# Patient Record
Sex: Female | Born: 1935 | Race: Black or African American | Hispanic: No | Smoking: Never smoker
Health system: Southern US, Community
[De-identification: ages and names within clinical notes are randomized; demographics above are authoritative.]

## PROBLEM LIST (undated history)

## (undated) DIAGNOSIS — I10 Essential (primary) hypertension: Secondary | ICD-10-CM

## (undated) DIAGNOSIS — F0390 Unspecified dementia without behavioral disturbance: Secondary | ICD-10-CM

## (undated) DIAGNOSIS — N63 Unspecified lump in unspecified breast: Secondary | ICD-10-CM

## (undated) DIAGNOSIS — I639 Cerebral infarction, unspecified: Secondary | ICD-10-CM

## (undated) HISTORY — PX: ABDOMINAL HYSTERECTOMY: SHX81

---

## 2003-06-09 ENCOUNTER — Other Ambulatory Visit: Payer: Self-pay

## 2003-11-26 ENCOUNTER — Ambulatory Visit: Payer: Self-pay | Admitting: Internal Medicine

## 2004-04-05 ENCOUNTER — Ambulatory Visit: Payer: Self-pay | Admitting: Internal Medicine

## 2004-04-25 ENCOUNTER — Ambulatory Visit: Payer: Self-pay | Admitting: Internal Medicine

## 2004-10-02 ENCOUNTER — Ambulatory Visit: Payer: Self-pay | Admitting: Internal Medicine

## 2004-10-26 ENCOUNTER — Ambulatory Visit: Payer: Self-pay | Admitting: Internal Medicine

## 2005-06-17 ENCOUNTER — Inpatient Hospital Stay: Payer: Self-pay | Admitting: Internal Medicine

## 2005-08-09 ENCOUNTER — Ambulatory Visit: Payer: Self-pay | Admitting: Obstetrics and Gynecology

## 2005-08-09 ENCOUNTER — Other Ambulatory Visit: Payer: Self-pay

## 2005-08-12 ENCOUNTER — Ambulatory Visit: Payer: Self-pay | Admitting: Obstetrics and Gynecology

## 2007-06-04 ENCOUNTER — Ambulatory Visit: Payer: Self-pay | Admitting: Otolaryngology

## 2008-03-22 ENCOUNTER — Ambulatory Visit: Payer: Self-pay | Admitting: Neurology

## 2008-04-15 ENCOUNTER — Emergency Department: Payer: Self-pay | Admitting: Emergency Medicine

## 2009-10-22 ENCOUNTER — Ambulatory Visit: Payer: Self-pay | Admitting: Internal Medicine

## 2010-11-24 ENCOUNTER — Ambulatory Visit: Payer: Self-pay | Admitting: Internal Medicine

## 2011-06-04 ENCOUNTER — Ambulatory Visit: Payer: Self-pay | Admitting: Family Medicine

## 2011-10-16 ENCOUNTER — Ambulatory Visit: Payer: Self-pay | Admitting: Family Medicine

## 2012-01-12 ENCOUNTER — Ambulatory Visit: Payer: Self-pay | Admitting: Family Medicine

## 2012-07-10 ENCOUNTER — Emergency Department: Payer: Self-pay | Admitting: Emergency Medicine

## 2013-04-18 ENCOUNTER — Ambulatory Visit: Payer: Self-pay | Admitting: Internal Medicine

## 2013-04-18 LAB — CBC WITH DIFFERENTIAL/PLATELET
Basophil #: 0.1 10*3/uL (ref 0.0–0.1)
Basophil %: 1.4 %
EOS PCT: 3.6 %
Eosinophil #: 0.3 10*3/uL (ref 0.0–0.7)
HCT: 33.9 % — AB (ref 35.0–47.0)
HGB: 10.8 g/dL — ABNORMAL LOW (ref 12.0–16.0)
Lymphocyte #: 1.8 10*3/uL (ref 1.0–3.6)
Lymphocyte %: 21.8 %
MCH: 28.6 pg (ref 26.0–34.0)
MCHC: 32 g/dL (ref 32.0–36.0)
MCV: 89 fL (ref 80–100)
MONO ABS: 0.5 x10 3/mm (ref 0.2–0.9)
MONOS PCT: 6.5 %
Neutrophil #: 5.4 10*3/uL (ref 1.4–6.5)
Neutrophil %: 66.7 %
PLATELETS: 319 10*3/uL (ref 150–440)
RBC: 3.79 10*6/uL — ABNORMAL LOW (ref 3.80–5.20)
RDW: 14.9 % — ABNORMAL HIGH (ref 11.5–14.5)
WBC: 8 10*3/uL (ref 3.6–11.0)

## 2013-04-18 LAB — URINALYSIS, COMPLETE
BACTERIA: NEGATIVE
Bilirubin,UR: NEGATIVE
Blood: NEGATIVE
GLUCOSE, UR: NEGATIVE mg/dL (ref 0–75)
Ketone: NEGATIVE
LEUKOCYTE ESTERASE: NEGATIVE
Nitrite: NEGATIVE
PH: 6 (ref 4.5–8.0)
RBC,UR: NONE SEEN /HPF (ref 0–5)
SPECIFIC GRAVITY: 1.01 (ref 1.003–1.030)

## 2013-04-18 LAB — BASIC METABOLIC PANEL
Anion Gap: 15 (ref 7–16)
BUN: 15 mg/dL (ref 7–18)
CREATININE: 1.48 mg/dL — AB (ref 0.60–1.30)
Calcium, Total: 8.6 mg/dL (ref 8.5–10.1)
Chloride: 105 mmol/L (ref 98–107)
Co2: 19 mmol/L — ABNORMAL LOW (ref 21–32)
EGFR (Non-African Amer.): 30 — ABNORMAL LOW
GFR CALC AF AMER: 35 — AB
GLUCOSE: 121 mg/dL — AB (ref 65–99)
OSMOLALITY: 280 (ref 275–301)
Potassium: 3.9 mmol/L (ref 3.5–5.1)
Sodium: 139 mmol/L (ref 136–145)

## 2013-04-20 LAB — URINE CULTURE

## 2014-02-09 ENCOUNTER — Ambulatory Visit: Payer: Self-pay | Admitting: Family Medicine

## 2015-08-26 DIAGNOSIS — I639 Cerebral infarction, unspecified: Secondary | ICD-10-CM

## 2015-08-26 HISTORY — DX: Cerebral infarction, unspecified: I63.9

## 2015-11-06 ENCOUNTER — Emergency Department: Payer: Medicare Other

## 2015-11-06 ENCOUNTER — Emergency Department
Admission: EM | Admit: 2015-11-06 | Discharge: 2015-11-06 | Disposition: A | Discharge status: Home / Self Care | Payer: Medicare Other | Attending: Emergency Medicine | Admitting: Emergency Medicine

## 2015-11-06 ENCOUNTER — Encounter: Payer: Self-pay | Admitting: Intensive Care

## 2015-11-06 DIAGNOSIS — Z79899 Other long term (current) drug therapy: Secondary | ICD-10-CM | POA: Diagnosis not present

## 2015-11-06 DIAGNOSIS — Y92129 Unspecified place in nursing home as the place of occurrence of the external cause: Secondary | ICD-10-CM | POA: Insufficient documentation

## 2015-11-06 DIAGNOSIS — Y999 Unspecified external cause status: Secondary | ICD-10-CM | POA: Insufficient documentation

## 2015-11-06 DIAGNOSIS — I10 Essential (primary) hypertension: Secondary | ICD-10-CM | POA: Insufficient documentation

## 2015-11-06 DIAGNOSIS — S0990XA Unspecified injury of head, initial encounter: Secondary | ICD-10-CM | POA: Diagnosis present

## 2015-11-06 DIAGNOSIS — W19XXXA Unspecified fall, initial encounter: Secondary | ICD-10-CM | POA: Insufficient documentation

## 2015-11-06 DIAGNOSIS — Y939 Activity, unspecified: Secondary | ICD-10-CM | POA: Insufficient documentation

## 2015-11-06 DIAGNOSIS — S0101XA Laceration without foreign body of scalp, initial encounter: Secondary | ICD-10-CM | POA: Diagnosis not present

## 2015-11-06 DIAGNOSIS — IMO0002 Reserved for concepts with insufficient information to code with codable children: Secondary | ICD-10-CM

## 2015-11-06 HISTORY — DX: Cerebral infarction, unspecified: I63.9

## 2015-11-06 HISTORY — DX: Unspecified dementia, unspecified severity, without behavioral disturbance, psychotic disturbance, mood disturbance, and anxiety: F03.90

## 2015-11-06 HISTORY — DX: Essential (primary) hypertension: I10

## 2015-11-06 LAB — COMPREHENSIVE METABOLIC PANEL
ALBUMIN: 3.2 g/dL — AB (ref 3.5–5.0)
ALK PHOS: 96 U/L (ref 38–126)
ALT: 11 U/L — ABNORMAL LOW (ref 14–54)
AST: 20 U/L (ref 15–41)
Anion gap: 6 (ref 5–15)
BILIRUBIN TOTAL: 0.2 mg/dL — AB (ref 0.3–1.2)
BUN: 32 mg/dL — AB (ref 6–20)
CALCIUM: 8.7 mg/dL — AB (ref 8.9–10.3)
CO2: 23 mmol/L (ref 22–32)
CREATININE: 1.38 mg/dL — AB (ref 0.44–1.00)
Chloride: 108 mmol/L (ref 101–111)
GFR calc Af Amer: 36 mL/min — ABNORMAL LOW (ref >60)
GFR, EST NON AFRICAN AMERICAN: 31 mL/min — AB (ref >60)
GLUCOSE: 99 mg/dL (ref 65–99)
Potassium: 4.2 mmol/L (ref 3.5–5.1)
Sodium: 137 mmol/L (ref 135–145)
TOTAL PROTEIN: 7.6 g/dL (ref 6.5–8.1)

## 2015-11-06 LAB — CBC WITH DIFFERENTIAL/PLATELET
BASOS ABS: 0 10*3/uL (ref 0–0.1)
BASOS PCT: 1 %
Eosinophils Absolute: 0.3 10*3/uL (ref 0–0.7)
Eosinophils Relative: 6 %
HEMATOCRIT: 31.5 % — AB (ref 35.0–47.0)
HEMOGLOBIN: 10.3 g/dL — AB (ref 12.0–16.0)
LYMPHS PCT: 36 %
Lymphs Abs: 1.9 10*3/uL (ref 1.0–3.6)
MCH: 27.6 pg (ref 26.0–34.0)
MCHC: 32.7 g/dL (ref 32.0–36.0)
MCV: 84.4 fL (ref 80.0–100.0)
MONO ABS: 0.2 10*3/uL (ref 0.2–0.9)
MONOS PCT: 4 %
NEUTROS ABS: 2.9 10*3/uL (ref 1.4–6.5)
NEUTROS PCT: 53 %
Platelets: 196 10*3/uL (ref 150–440)
RBC: 3.74 MIL/uL — ABNORMAL LOW (ref 3.80–5.20)
RDW: 17.1 % — ABNORMAL HIGH (ref 11.5–14.5)
WBC: 5.3 10*3/uL (ref 3.6–11.0)

## 2015-11-06 NOTE — ED Provider Notes (Signed)
[facility] Emergency Department Provider Note     L5 caveat: Review of systems and history is limited by dementia   Time seen: ----------------------------------------- 8:31 AM on 11/06/2015 -----------------------------------------    I have reviewed the triage vital signs and the nursing notes.   HISTORY  Chief Complaint Facial Laceration and Fall    HPI Sheri Jordan is a 82 y.o. female who presents to the ER being brought by EMS after a fall the nursing home. Fall was unwitnessed. Patient unable to give review of systems or report due to dementia.   No past medical history on file.  There are no active problems to display for this patient.   No past surgical history on file.  Allergies Review of patient's allergies indicates not on file.  Social History Social History  Substance Use Topics  . Smoking status: Not on file  . Smokeless tobacco: Not on file  . Alcohol use Not on file    Review of Systems Unknown, positive for right facial laceration  ____________________________________________   PHYSICAL EXAM:  VITAL SIGNS: ED Triage Vitals  Enc Vitals Group     BP      Pulse      Resp      Temp      Temp src      SpO2      Weight      Height      Head Circumference      Peak Flow      Pain Score      Pain Loc      Pain Edu?      Excl. in [place]?     Constitutional: Alert, No acute distress ENT   Head: Normocephalic, 5 cm linear right temporal scalp laceration   Nose: No congestion/rhinnorhea.   Mouth/Throat: Mucous membranes are moist.   Neck: No stridor. Cardiovascular: Normal rate, regular rhythm.  Respiratory: Normal respiratory effort without tachypnea nor retractions. Breath sounds are clear and equal bilaterally. No wheezes/rales/rhonchi. Gastrointestinal: Soft and nontender. Normal bowel sounds Musculoskeletal: Contractures noted in the lower extremities Neurologic:  No gross focal  neurologic deficits are appreciated.  Skin:  5 cm linear frontotemporal scalp laceration with pulsatile bleeding ____________________________________________  ED COURSE:  Pertinent labs & imaging results that were available during my care of the patient were reviewed by me and considered in my medical decision making (see chart for details). Clinical Course  Patient presents to ER after unwitnessed fall the nursing home. She will require emergent laceration repair due to pulsatile bleeding.  Marland Kitchen.Laceration Repair Date/Time: 11/06/2015 8:33 AM Performed by: Earleen Newport Authorized by: Lenise Arena E   Consent:    Consent obtained:  Emergent situation Laceration details:    Location:  Scalp   Scalp location:  R temporal   Length (cm):  5   Depth (mm):  0.5 Repair type:    Repair type:  Complex Pre-procedure details:    Preparation:  Patient was prepped and draped in usual sterile fashion Exploration:    Limited defect created (wound extended): no     Hemostasis achieved with:  Epinephrine and direct pressure   Wound extent: vascular damage     Contaminated: no   Treatment:    Area cleansed with:  Betadine and saline   Amount of cleaning:  Standard   Irrigation solution:  Sterile saline   Debridement:  None Skin repair:    Repair method:  Sutures   Suture size:  4-0  Suture material:  Nylon   Suture technique:  Running   Number of sutures:  8 Approximation:    Approximation:  Close   Vermilion border: well-aligned   Post-procedure details:    Dressing:  Antibiotic ointment and non-adherent dressing   Patient tolerance of procedure:  Tolerated well, no immediate complications Comments:     Patient presented with pulsatile bleeding from the right temporal scalp. This appeared to be an arteriole from the temporal artery. With local injection and tight wound repair, hemostasis was obtained.   ____________________________________________   LABS (pertinent  positives/negatives)  Labs Reviewed  CBC WITH DIFFERENTIAL/PLATELET - Abnormal; Notable for the following:       Result Value   RBC 3.74 (*)    Hemoglobin 10.3 (*)    HCT 31.5 (*)    RDW 17.1 (*)    All other components within normal limits  COMPREHENSIVE METABOLIC PANEL - Abnormal; Notable for the following:    BUN 32 (*)    Creatinine, Ser 1.38 (*)    Calcium 8.7 (*)    Albumin 3.2 (*)    ALT 11 (*)    Total Bilirubin 0.2 (*)    GFR calc non Af Amer 31 (*)    GFR calc Af Amer 36 (*)    All other components within normal limits   ____________________________________________  FINAL ASSESSMENT AND PLAN  Laceration repair, minor head injury  Plan: Patient with labs and imaging as dictated above. Patient's labs are at baseline for her, she was observed without any rebleeding from the wound. Surgicel was placed over the wound with a nonadherent dressing and Kerlix. She is stable for outpatient follow-up with her doctor.   Earleen Newport, MD   Note: This dictation was prepared with Dragon dictation. Any transcriptional errors that result from this process are unintentional    Earleen Newport, MD 11/06/15 1123

## 2015-11-06 NOTE — ED Notes (Signed)
Pt given apple sauce and juice. 

## 2015-11-06 NOTE — ED Notes (Signed)
Secretary calling for transport for EMS back to [facility]

## 2015-11-06 NOTE — ED Notes (Signed)
Patient transported to CT 

## 2015-11-06 NOTE — ED Triage Notes (Signed)
Pt arrived by EMS from [facility]. Staff reports unwitnessed fall. Staff wrapped laceration due to blood squirting from site. MD at bedside upon arrival. Pts confusion is baseline per EMS from staff. EMS vitals stable. Blood sugar EMS 86. Patient Alert

## 2015-11-09 ENCOUNTER — Telehealth: Payer: Self-pay

## 2015-11-09 NOTE — Telephone Encounter (Signed)
Transportation for patient called wanting to schedule an appointment for a Right Breast biopsy on patient. Patient has not had Mammogram. Explained that mammogram and ultrasound would need to be obtained prior to being able to do breast biopsy. They will obtain order from Dr. Brynda Greathouse, call [facility] and schedule Mammogram, and then call back for an appointment.  Please schedule patient with next available provider after mammogram has been completed.

## 2015-11-13 ENCOUNTER — Other Ambulatory Visit: Payer: Self-pay | Admitting: Internal Medicine

## 2015-11-13 DIAGNOSIS — C50411 Malignant neoplasm of upper-outer quadrant of right female breast: Secondary | ICD-10-CM

## 2015-11-14 NOTE — Telephone Encounter (Signed)
Made return phone call to Dr. Jerene Dilling office whom states that they do not want to schedule office visit at this time. They will obtain mammogram and go from there.   Asked to return phone call when they would like an appointment scheduled.

## 2015-11-27 ENCOUNTER — Ambulatory Visit: Admission: RE | Admit: 2015-11-27 | Payer: Medicare Other | Source: Ambulatory Visit

## 2015-11-27 ENCOUNTER — Ambulatory Visit
Admission: RE | Admit: 2015-11-27 | Discharge: 2015-11-27 | Disposition: A | Discharge status: Home / Self Care | Payer: Medicare Other | Source: Ambulatory Visit | Attending: Internal Medicine | Admitting: Internal Medicine

## 2015-11-27 DIAGNOSIS — N6312 Unspecified lump in the right breast, upper inner quadrant: Secondary | ICD-10-CM | POA: Diagnosis not present

## 2015-11-27 DIAGNOSIS — C50411 Malignant neoplasm of upper-outer quadrant of right female breast: Secondary | ICD-10-CM

## 2015-11-27 DIAGNOSIS — N63 Unspecified lump in unspecified breast: Secondary | ICD-10-CM | POA: Diagnosis present

## 2015-11-27 HISTORY — DX: Unspecified lump in unspecified breast: N63.0

## 2015-12-01 ENCOUNTER — Other Ambulatory Visit: Payer: Self-pay | Admitting: Internal Medicine

## 2015-12-01 DIAGNOSIS — N6310 Unspecified lump in the right breast, unspecified quadrant: Secondary | ICD-10-CM

## 2015-12-05 ENCOUNTER — Ambulatory Visit
Admission: RE | Admit: 2015-12-05 | Discharge: 2015-12-05 | Disposition: A | Discharge status: Home / Self Care | Payer: Medicare Other | Source: Ambulatory Visit | Attending: Internal Medicine | Admitting: Internal Medicine

## 2015-12-19 ENCOUNTER — Ambulatory Visit
Admission: RE | Admit: 2015-12-19 | Discharge: 2015-12-19 | Disposition: A | Discharge status: Home / Self Care | Payer: Medicare Other | Source: Ambulatory Visit | Attending: Internal Medicine | Admitting: Internal Medicine

## 2015-12-19 DIAGNOSIS — N6310 Unspecified lump in the right breast, unspecified quadrant: Secondary | ICD-10-CM

## 2015-12-21 LAB — SURGICAL PATHOLOGY

## 2015-12-25 ENCOUNTER — Telehealth: Payer: Self-pay | Admitting: *Deleted

## 2015-12-25 NOTE — Telephone Encounter (Signed)
Patient with newly diagnosed breast cancer.  Called and spoke with Dr. Brynda Greathouse in regards to surgical or medical oncology consult.  Per Dr. Brynda Greathouse, the patient is in a nursing home with multiple co-morbidities and is probably not a surgical candidate, or could tolerate any chemotherapy.  He is going to discuss options with her family and will plan accordingly.

## 2016-03-28 DEATH — deceased

## 2017-08-31 IMAGING — CT CT HEAD W/O CM
3 of 4 series · 14 of 47 positions shown, 16 images · non-contrast
Comparison: 04/15/2008 head CT.

CLINICAL DATA: Fall.  Head injury.  Confusion.

EXAM:
CT HEAD WITHOUT CONTRAST
TECHNIQUE: Contiguous axial images were obtained from the base of the skull
through the vertex without intravenous contrast.

[Series 2: head wo · axial · 0.39mm/px · z∈[+332,+452]mm · 8 of 30 slices shown, 10 images]
[im 3/30  brain]
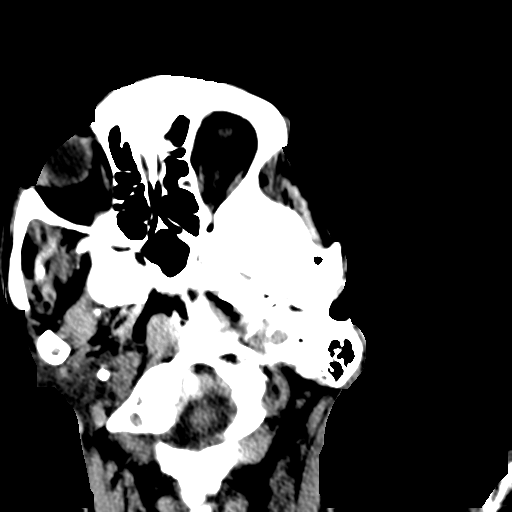
[im 3/30  bone]
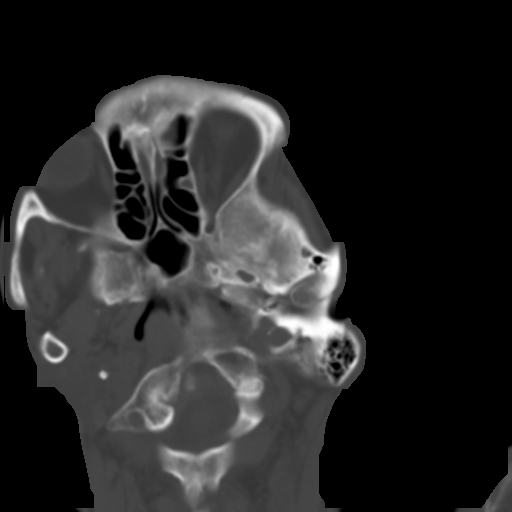
[im 7/30  brain]
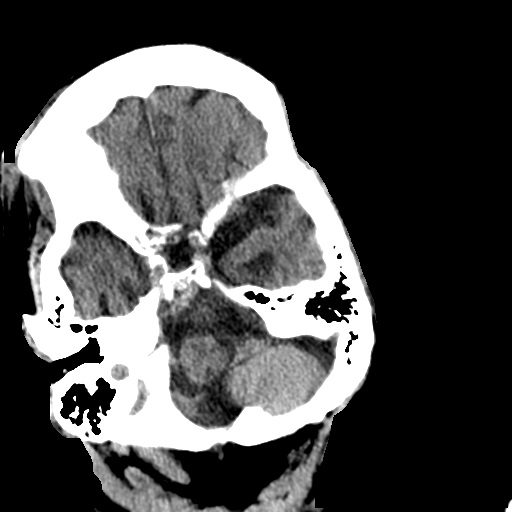
[im 11/30  brain]
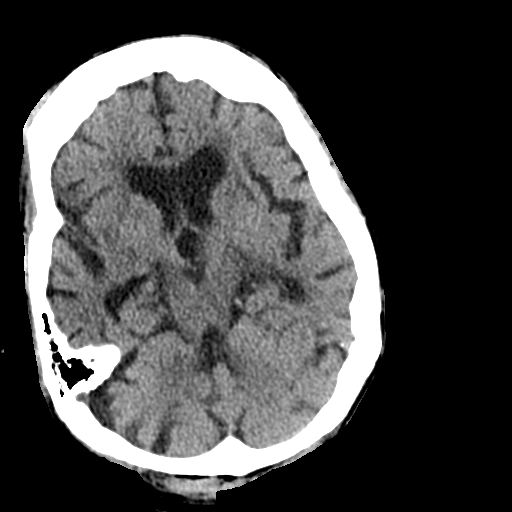
[im 13/30  brain]
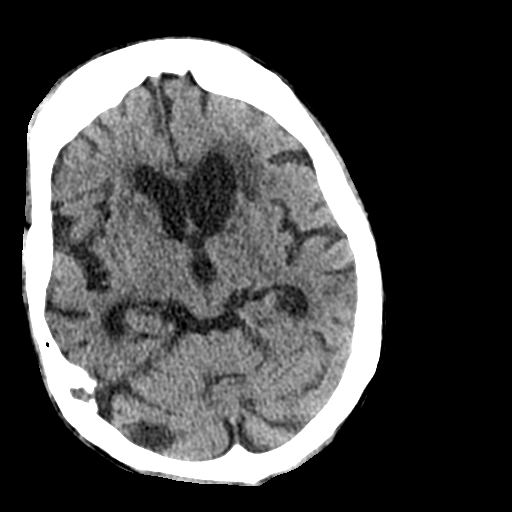
[im 17/30  brain]
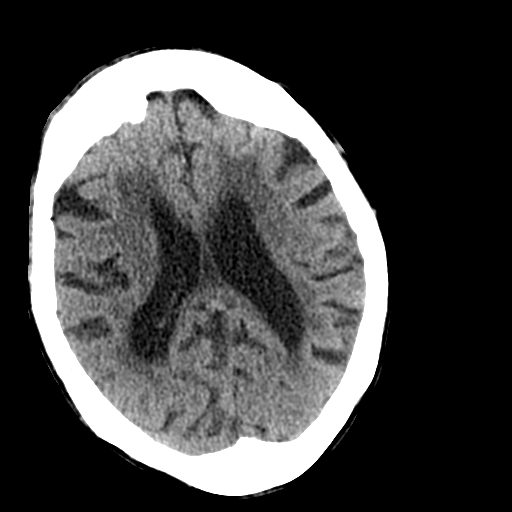
[im 17/30  bone]
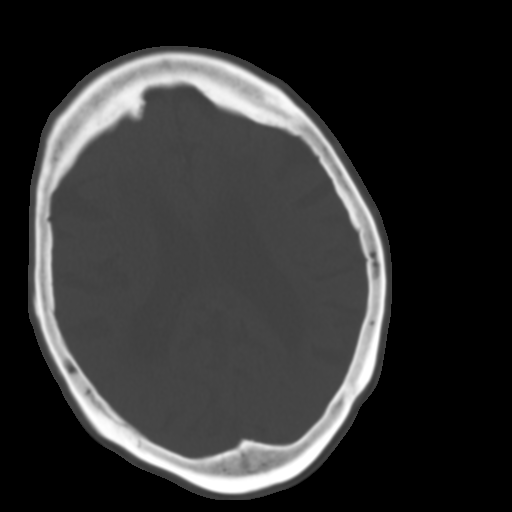
[im 19/30  brain]
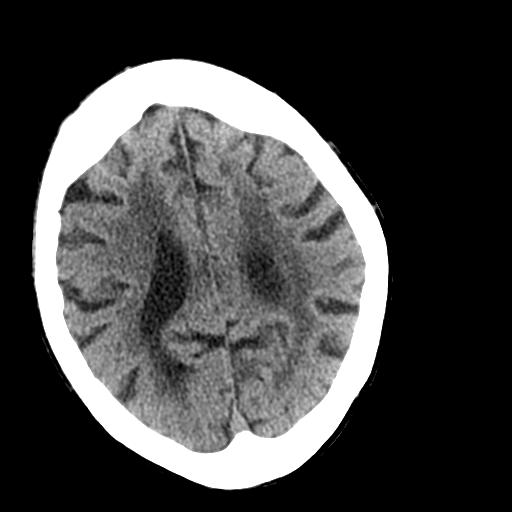
[im 23/30  brain]
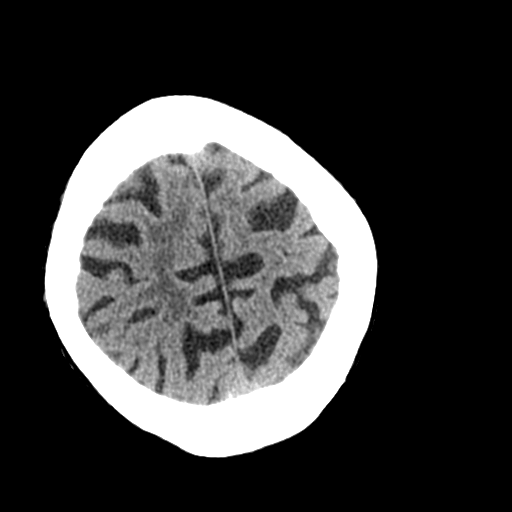
[im 27/30  brain]
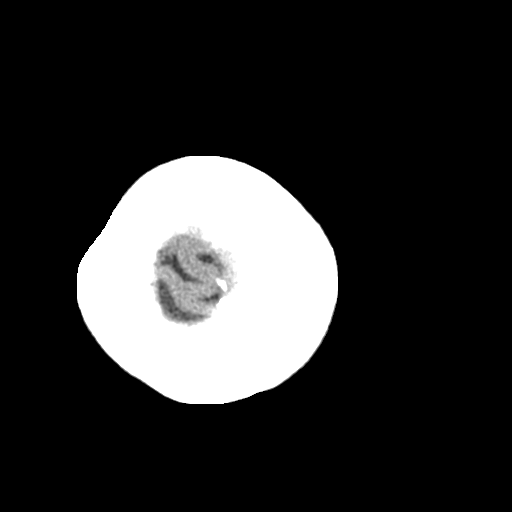

[Series 6: coronal soft tissue · coronal · 0.28mm/px · 3 of 63 slices shown]
[im 21/63  brain]
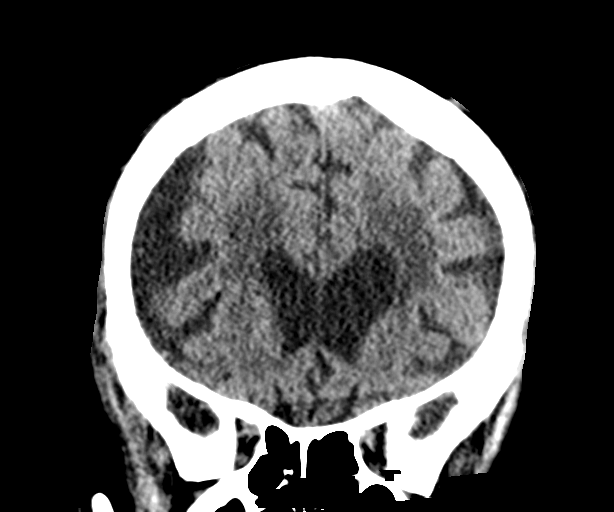
[im 28/63  brain]
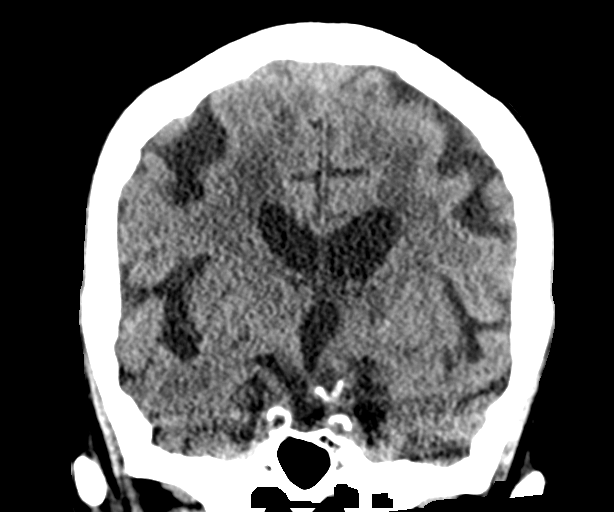
[im 35/63  brain]
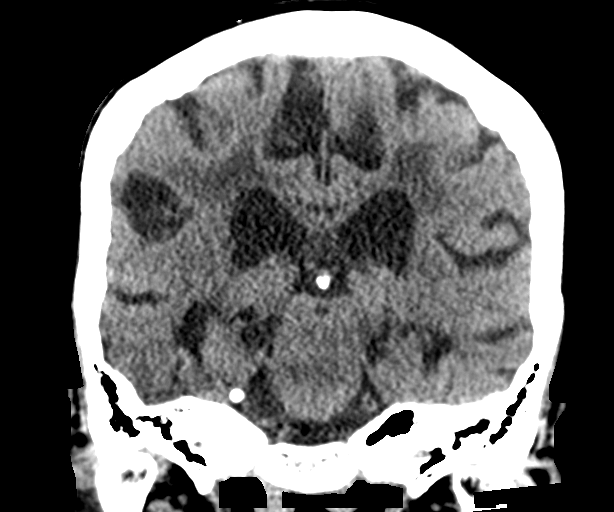

[Series 7: sagittal soft tissue · sagittal · 0.28mm/px · 3 of 47 slices shown]
[im 16/47  brain]
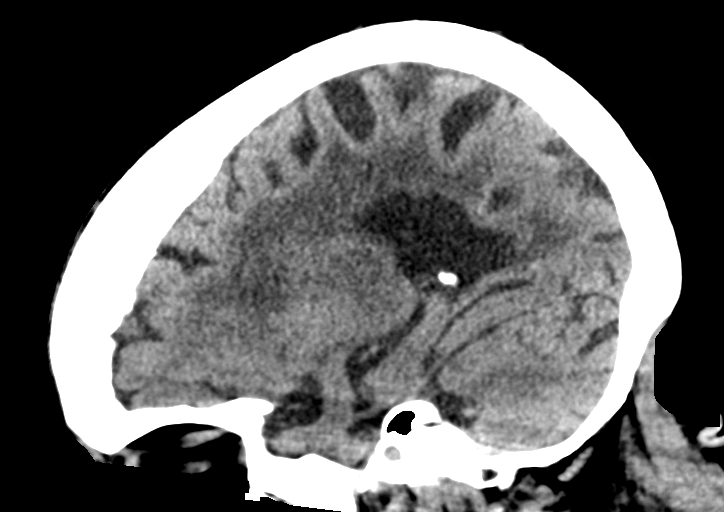
[im 24/47  brain]
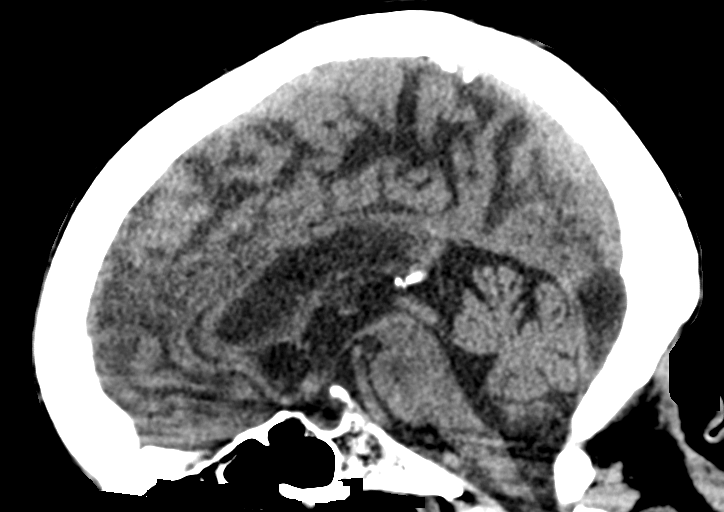
[im 31/47  brain]
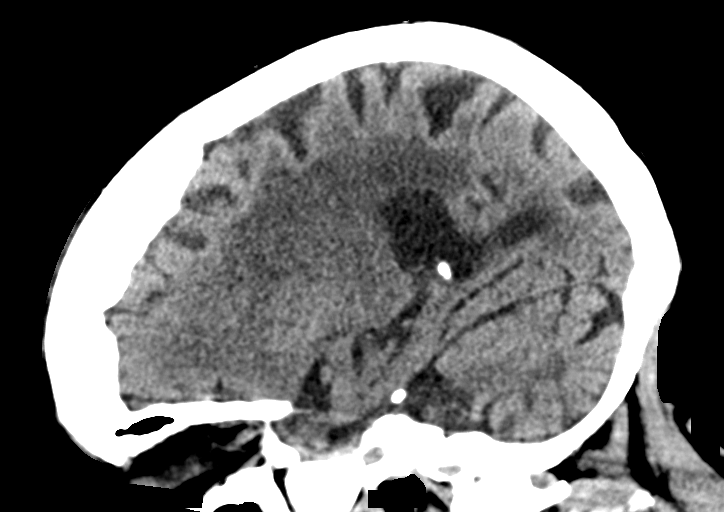

[14 of 47 positions shown; findings below may reference images not displayed]

FINDINGS: Brain: No evidence of parenchymal hemorrhage or extra-axial fluid
collection. No mass lesion, mass effect, or midline shift. No CT
evidence of acute infarction. Right cerebellar hemisphere
encephalomalacia appears new. Intracranial atherosclerosis.
Prominent generalized cerebral volume loss. Nonspecific moderate
subcortical and periventricular white matter hypodensity, most in
keeping with chronic small vessel ischemic change. Stable
ventricles, concordant with the degree of volume loss.

Vascular: No hyperdense vessel or unexpected calcification.

Skull: No evidence of calvarial fracture.

Sinuses/Orbits: Partial opacification of the left ethmoidal air
cells and mucosal thickening in the visualized right maxillary
sinus.

Other: There is a small to moderate lateral right frontal scalp
contusion. The mastoid air cells are unopacified.
IMPRESSION: 1. Small to moderate right lateral frontal scalp contusion.
2. No evidence of acute intracranial abnormality. No evidence of
calvarial fracture.
3. Prominent generalized cerebral volume loss and chronic small
vessel ischemia.
4. Right cerebellar hemisphere encephalomalacia, most consistent
with remote infarct, although new since [DATE].
